# Patient Record
Sex: Male | Born: 1985 | Hispanic: Yes | Marital: Single | Smoking: Current some day smoker
Health system: Southern US, Community
[De-identification: ages and names within clinical notes are randomized; demographics above are authoritative.]

---

## 2014-01-23 ENCOUNTER — Emergency Department: Payer: Self-pay | Admitting: Emergency Medicine

## 2014-01-23 LAB — CK TOTAL AND CKMB (NOT AT ARMC)
CK, Total: 128 U/L
CK-MB: 0.8 ng/mL (ref 0.5–3.6)

## 2014-01-23 LAB — BASIC METABOLIC PANEL
ANION GAP: 8 (ref 7–16)
BUN: 8 mg/dL (ref 7–18)
CO2: 27 mmol/L (ref 21–32)
Calcium, Total: 9.2 mg/dL (ref 8.5–10.1)
Chloride: 105 mmol/L (ref 98–107)
Creatinine: 0.89 mg/dL (ref 0.60–1.30)
EGFR (African American): >60
EGFR (Non-African Amer.): >60
Glucose: 122 mg/dL — ABNORMAL HIGH (ref 65–99)
Osmolality: 279 (ref 275–301)
POTASSIUM: 3.8 mmol/L (ref 3.5–5.1)
Sodium: 140 mmol/L (ref 136–145)

## 2014-01-23 LAB — CBC
HCT: 42.5 % (ref 40.0–52.0)
HGB: 14.6 g/dL (ref 13.0–18.0)
MCH: 29 pg (ref 26.0–34.0)
MCHC: 34.2 g/dL (ref 32.0–36.0)
MCV: 85 fL (ref 80–100)
PLATELETS: 232 10*3/uL (ref 150–440)
RBC: 5.02 10*6/uL (ref 4.40–5.90)
RDW: 13.2 % (ref 11.5–14.5)
WBC: 7.9 10*3/uL (ref 3.8–10.6)

## 2014-01-23 LAB — TROPONIN I

## 2015-08-11 ENCOUNTER — Emergency Department
Admission: EM | Admit: 2015-08-11 | Discharge: 2015-08-11 | Disposition: A | Discharge status: Home / Self Care | Payer: Self-pay

## 2015-08-26 IMAGING — CR DG CHEST 2V
1 series · 2 of 2 positions shown · non-contrast
Comparison: None.

CLINICAL DATA: Left-sided chest pain.

EXAM:
CHEST  2 VIEW

[Series 1: dxr chest pa (or ap) and lateral · 0.14mm/px · 2 of 2 slices shown]
[im 1/2]
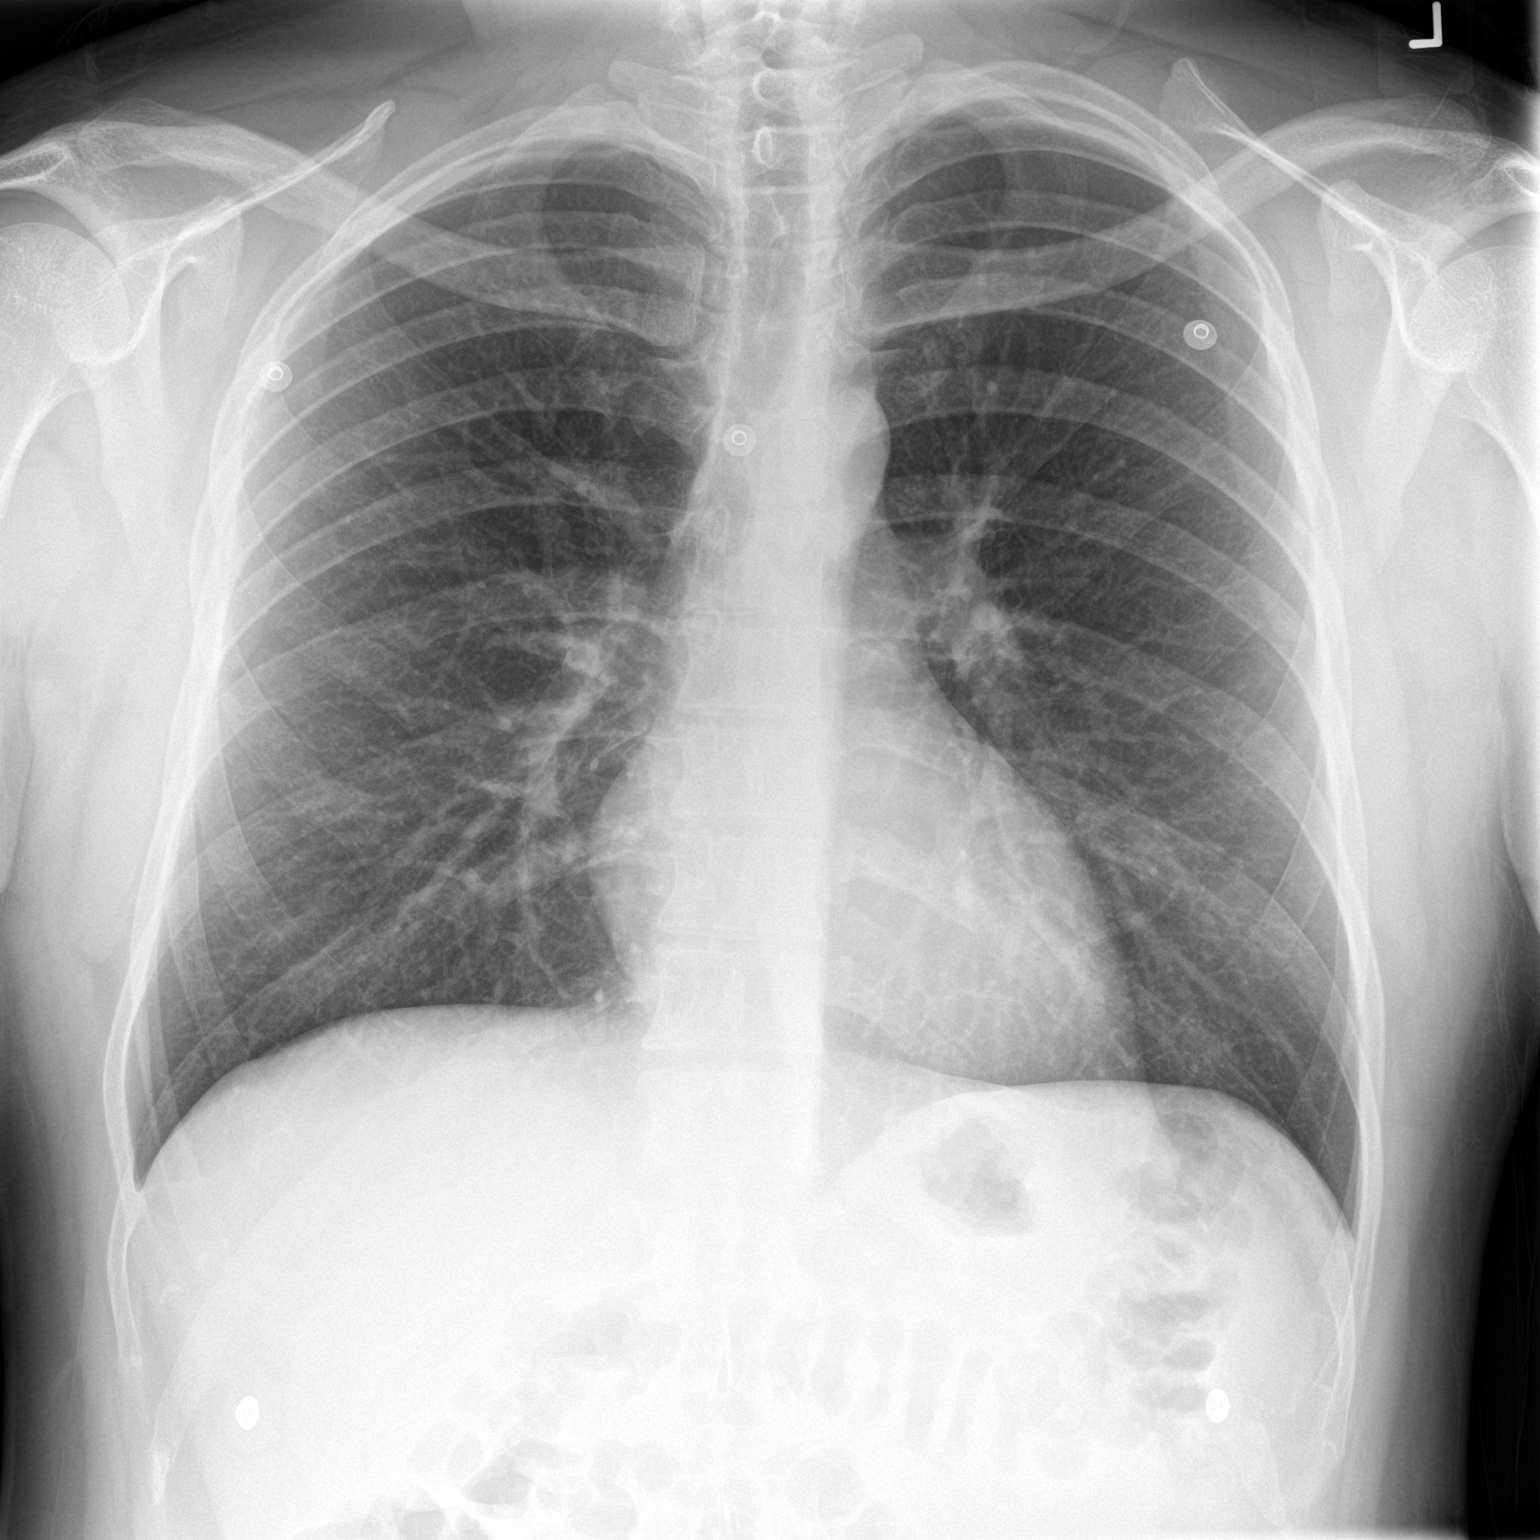
[im 2/2]
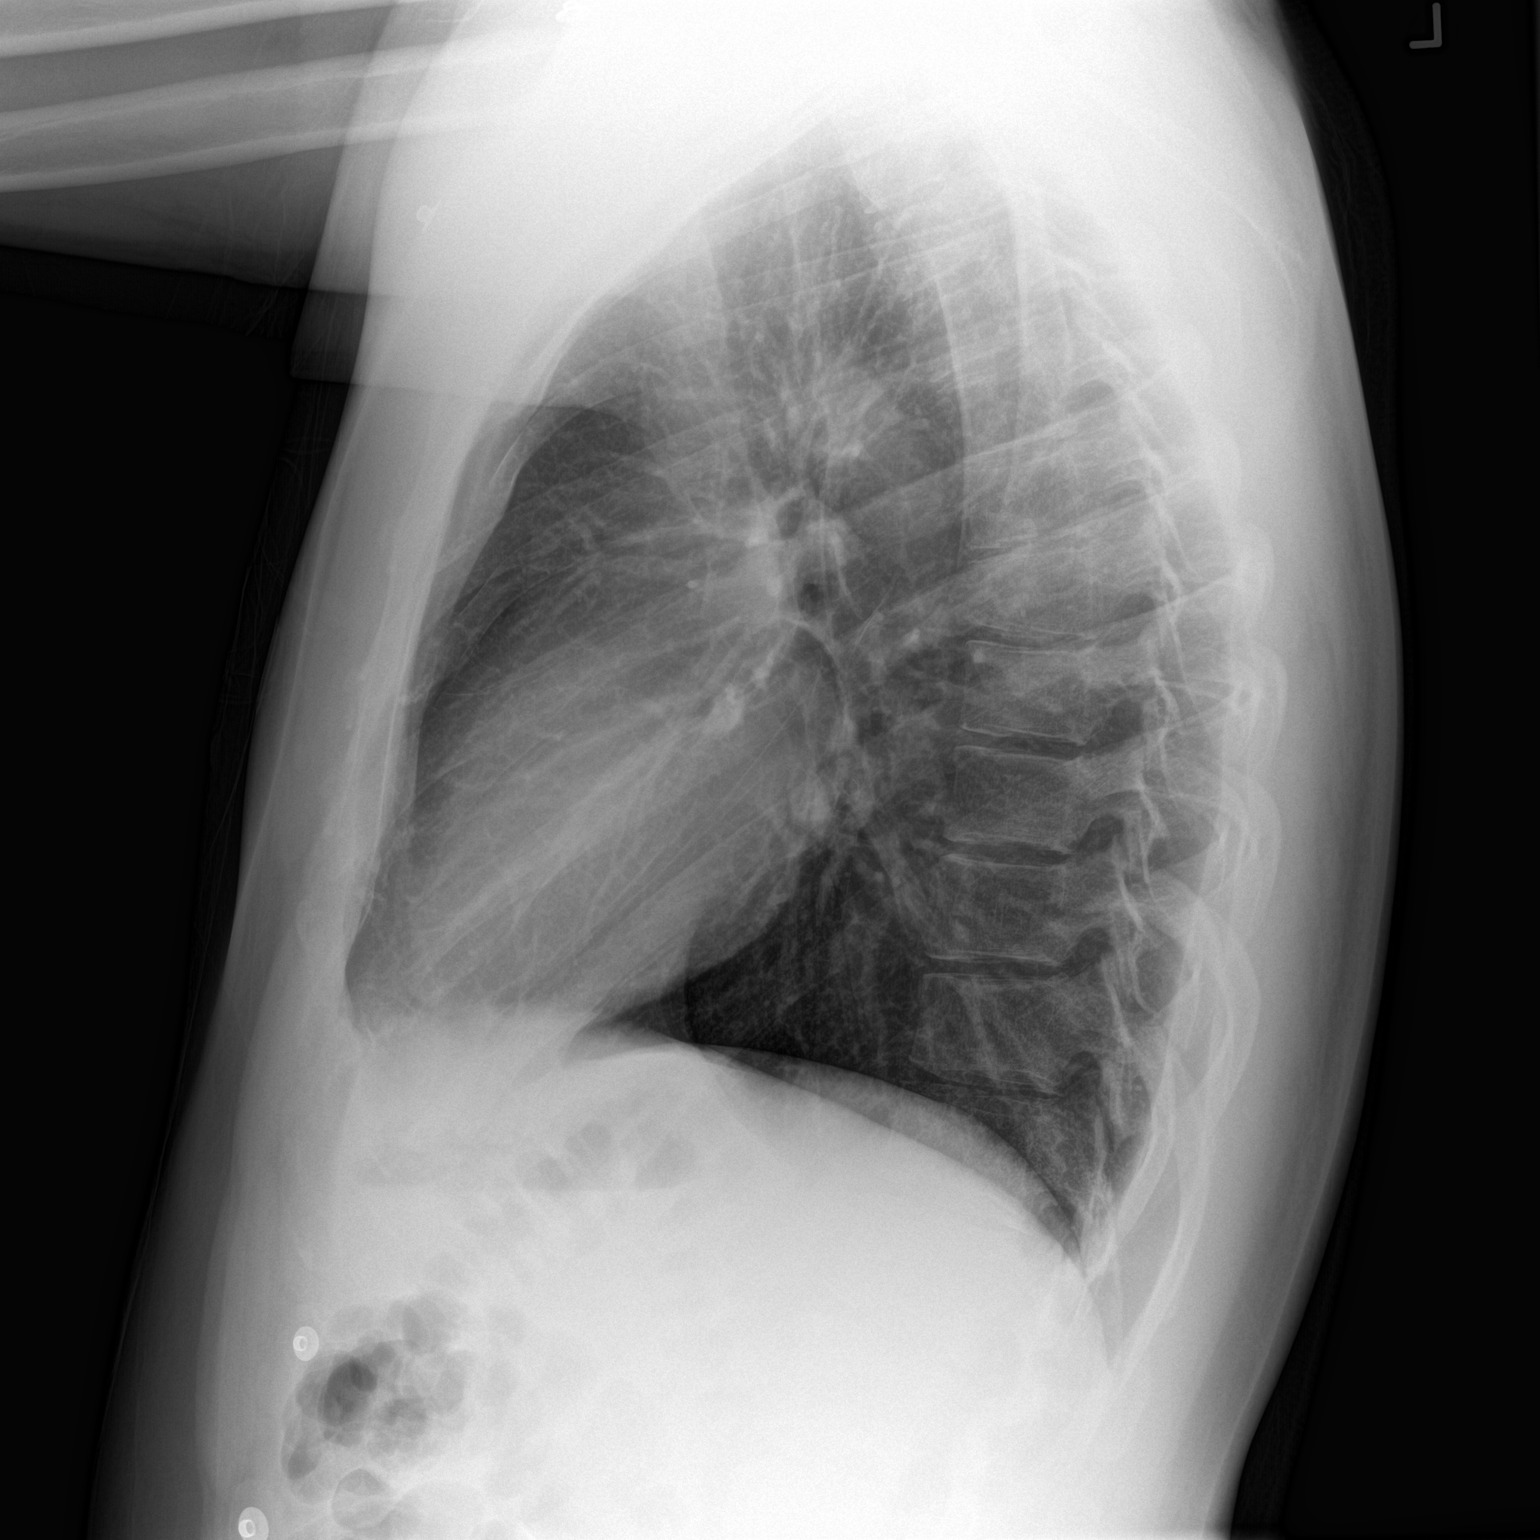

[2 of 2 positions shown; findings below may reference images not displayed]

FINDINGS: The heart size and mediastinal contours are within normal limits.
Both lungs are clear. The visualized skeletal structures are
unremarkable.
IMPRESSION: No active cardiopulmonary disease.

## 2018-11-30 ENCOUNTER — Other Ambulatory Visit: Payer: Self-pay

## 2018-11-30 DIAGNOSIS — Z20822 Contact with and (suspected) exposure to covid-19: Secondary | ICD-10-CM

## 2018-12-06 LAB — NOVEL CORONAVIRUS, NAA: SARS-CoV-2, NAA: NOT DETECTED

## 2018-12-14 ENCOUNTER — Telehealth: Payer: Self-pay | Admitting: General Practice

## 2018-12-14 NOTE — Telephone Encounter (Signed)
Pt was given covid-19(not detected) result/ Pt verbalized understanding  °

## 2020-12-10 ENCOUNTER — Observation Stay: Payer: Self-pay | Admitting: Anesthesiology

## 2020-12-10 ENCOUNTER — Inpatient Hospital Stay
Admission: EM | Admit: 2020-12-10 | Discharge: 2020-12-15 | DRG: 340 | Disposition: A | Discharge status: Home / Self Care | Payer: Self-pay | Attending: Surgery | Admitting: Surgery

## 2020-12-10 ENCOUNTER — Encounter
Admission: EM | Disposition: A | Discharge status: Home / Self Care | Payer: Self-pay | Source: Home / Self Care | Attending: Surgery

## 2020-12-10 ENCOUNTER — Emergency Department: Payer: Self-pay

## 2020-12-10 ENCOUNTER — Encounter: Payer: Self-pay | Admitting: Intensive Care

## 2020-12-10 ENCOUNTER — Other Ambulatory Visit: Payer: Self-pay

## 2020-12-10 ENCOUNTER — Observation Stay: Payer: Self-pay

## 2020-12-10 DIAGNOSIS — Z20822 Contact with and (suspected) exposure to covid-19: Secondary | ICD-10-CM | POA: Diagnosis present

## 2020-12-10 DIAGNOSIS — K3580 Unspecified acute appendicitis: Secondary | ICD-10-CM | POA: Diagnosis present

## 2020-12-10 DIAGNOSIS — Z4659 Encounter for fitting and adjustment of other gastrointestinal appliance and device: Secondary | ICD-10-CM

## 2020-12-10 DIAGNOSIS — F17210 Nicotine dependence, cigarettes, uncomplicated: Secondary | ICD-10-CM | POA: Diagnosis present

## 2020-12-10 DIAGNOSIS — K3532 Acute appendicitis with perforation and localized peritonitis, without abscess: Secondary | ICD-10-CM

## 2020-12-10 DIAGNOSIS — K3533 Acute appendicitis with perforation and localized peritonitis, with abscess: Principal | ICD-10-CM | POA: Diagnosis present

## 2020-12-10 HISTORY — PX: XI ROBOTIC LAPAROSCOPIC ASSISTED APPENDECTOMY: SHX6877

## 2020-12-10 LAB — CBC
HCT: 42.5 % (ref 39.0–52.0)
Hemoglobin: 15.1 g/dL (ref 13.0–17.0)
MCH: 30.1 pg (ref 26.0–34.0)
MCHC: 35.5 g/dL (ref 30.0–36.0)
MCV: 84.7 fL (ref 80.0–100.0)
Platelets: 262 10*3/uL (ref 150–400)
RBC: 5.02 MIL/uL (ref 4.22–5.81)
RDW: 12.4 % (ref 11.5–15.5)
WBC: 17.9 10*3/uL — ABNORMAL HIGH (ref 4.0–10.5)
nRBC: 0 % (ref 0.0–0.2)

## 2020-12-10 LAB — COMPREHENSIVE METABOLIC PANEL
ALT: 30 U/L (ref 0–44)
AST: 20 U/L (ref 15–41)
Albumin: 4.5 g/dL (ref 3.5–5.0)
Alkaline Phosphatase: 88 U/L (ref 38–126)
Anion gap: 10 (ref 5–15)
BUN: 11 mg/dL (ref 6–20)
CO2: 23 mmol/L (ref 22–32)
Calcium: 9.1 mg/dL (ref 8.9–10.3)
Chloride: 101 mmol/L (ref 98–111)
Creatinine, Ser: 0.8 mg/dL (ref 0.61–1.24)
GFR, Estimated: >60 mL/min (ref >60)
Glucose, Bld: 168 mg/dL — ABNORMAL HIGH (ref 70–99)
Potassium: 3.6 mmol/L (ref 3.5–5.1)
Sodium: 134 mmol/L — ABNORMAL LOW (ref 135–145)
Total Bilirubin: 2.1 mg/dL — ABNORMAL HIGH (ref 0.3–1.2)
Total Protein: 8.2 g/dL — ABNORMAL HIGH (ref 6.5–8.1)

## 2020-12-10 LAB — URINALYSIS, COMPLETE (UACMP) WITH MICROSCOPIC
Bacteria, UA: NONE SEEN
Bilirubin Urine: NEGATIVE
Glucose, UA: NEGATIVE mg/dL
Hgb urine dipstick: NEGATIVE
Ketones, ur: NEGATIVE mg/dL
Leukocytes,Ua: NEGATIVE
Nitrite: NEGATIVE
Protein, ur: 30 mg/dL — AB
Specific Gravity, Urine: 1.032 — ABNORMAL HIGH (ref 1.005–1.030)
Squamous Epithelial / HPF: NONE SEEN (ref 0–5)
pH: 6 (ref 5.0–8.0)

## 2020-12-10 LAB — RESP PANEL BY RT-PCR (FLU A&B, COVID) ARPGX2
Influenza A by PCR: NEGATIVE
Influenza B by PCR: NEGATIVE
SARS Coronavirus 2 by RT PCR: NEGATIVE

## 2020-12-10 LAB — LIPASE, BLOOD: Lipase: 41 U/L (ref 11–51)

## 2020-12-10 SURGERY — APPENDECTOMY, ROBOT-ASSISTED, LAPAROSCOPIC
Anesthesia: General | Site: Abdomen

## 2020-12-10 MED ORDER — SODIUM CHLORIDE 0.9 % IV SOLN
INTRAVENOUS | Status: DC | PRN
  Administered 2020-12-10 – 2020-12-13 (×3): 250 mL via INTRAVENOUS
  Administered 2020-12-14 – 2020-12-15 (×2): 1000 mL via INTRAVENOUS

## 2020-12-10 MED ORDER — KETOROLAC TROMETHAMINE 30 MG/ML IJ SOLN
INTRAMUSCULAR | Status: AC
  Filled 2020-12-10: qty 1

## 2020-12-10 MED ORDER — 0.9 % SODIUM CHLORIDE (POUR BTL) OPTIME
TOPICAL | Status: DC | PRN
  Administered 2020-12-10: 15 mL

## 2020-12-10 MED ORDER — ONDANSETRON HCL 4 MG/2ML IJ SOLN
INTRAMUSCULAR | Status: DC | PRN
  Administered 2020-12-10: 4 mg via INTRAVENOUS

## 2020-12-10 MED ORDER — FENTANYL CITRATE (PF) 100 MCG/2ML IJ SOLN: 25.0000 ug | INTRAMUSCULAR | Status: DC | PRN

## 2020-12-10 MED ORDER — ONDANSETRON HCL 4 MG/2ML IJ SOLN
4.0000 mg | Freq: Once | INTRAMUSCULAR | Status: AC
  Administered 2020-12-10: 4 mg via INTRAVENOUS
  Filled 2020-12-10: qty 2

## 2020-12-10 MED ORDER — SODIUM CHLORIDE 0.9 % IR SOLN
Status: DC | PRN
  Administered 2020-12-10: 325 mL

## 2020-12-10 MED ORDER — MORPHINE SULFATE (PF) 4 MG/ML IV SOLN
4.0000 mg | Freq: Once | INTRAVENOUS | Status: AC
  Administered 2020-12-10: 4 mg via INTRAVENOUS
  Filled 2020-12-10: qty 1

## 2020-12-10 MED ORDER — ONDANSETRON HCL 4 MG/2ML IJ SOLN: 4.0000 mg | Freq: Four times a day (QID) | INTRAMUSCULAR | Status: DC | PRN

## 2020-12-10 MED ORDER — HYDROCODONE-ACETAMINOPHEN 5-325 MG PO TABS
1.0000 | ORAL_TABLET | ORAL | Status: DC | PRN
  Administered 2020-12-11: 2 via ORAL
  Administered 2020-12-11 – 2020-12-12 (×4): 1 via ORAL
  Administered 2020-12-13 – 2020-12-15 (×8): 2 via ORAL
  Filled 2020-12-10: qty 1
  Filled 2020-12-10: qty 2
  Filled 2020-12-10: qty 1
  Filled 2020-12-10 (×5): qty 2
  Filled 2020-12-10 (×2): qty 1
  Filled 2020-12-10 (×3): qty 2

## 2020-12-10 MED ORDER — KETOROLAC TROMETHAMINE 30 MG/ML IJ SOLN
INTRAMUSCULAR | Status: DC | PRN
  Administered 2020-12-10: 30 mg via INTRAVENOUS

## 2020-12-10 MED ORDER — PROPOFOL 10 MG/ML IV BOLUS
INTRAVENOUS | Status: DC | PRN
  Administered 2020-12-10: 200 mg via INTRAVENOUS

## 2020-12-10 MED ORDER — ROCURONIUM BROMIDE 100 MG/10ML IV SOLN
INTRAVENOUS | Status: DC | PRN
  Administered 2020-12-10: 5 mg via INTRAVENOUS
  Administered 2020-12-10: 45 mg via INTRAVENOUS

## 2020-12-10 MED ORDER — MIDAZOLAM HCL 2 MG/2ML IJ SOLN
INTRAMUSCULAR | Status: AC
  Filled 2020-12-10: qty 2

## 2020-12-10 MED ORDER — MORPHINE SULFATE (PF) 2 MG/ML IV SOLN
2.0000 mg | INTRAVENOUS | Status: DC | PRN
  Administered 2020-12-11: 2 mg via INTRAVENOUS
  Filled 2020-12-10: qty 1

## 2020-12-10 MED ORDER — TRAMADOL HCL 50 MG PO TABS
50.0000 mg | ORAL_TABLET | Freq: Four times a day (QID) | ORAL | Status: DC | PRN
  Administered 2020-12-11: 50 mg via ORAL
  Filled 2020-12-10: qty 1

## 2020-12-10 MED ORDER — MIDAZOLAM HCL 2 MG/2ML IJ SOLN
INTRAMUSCULAR | Status: DC | PRN
  Administered 2020-12-10: 2 mg via INTRAVENOUS

## 2020-12-10 MED ORDER — DEXMEDETOMIDINE (PRECEDEX) IN NS 20 MCG/5ML (4 MCG/ML) IV SYRINGE
PREFILLED_SYRINGE | INTRAVENOUS | Status: DC | PRN
  Administered 2020-12-10: 8 ug via INTRAVENOUS
  Administered 2020-12-10: 12 ug via INTRAVENOUS

## 2020-12-10 MED ORDER — ENOXAPARIN SODIUM 40 MG/0.4ML IJ SOSY
40.0000 mg | PREFILLED_SYRINGE | INTRAMUSCULAR | Status: DC
Start: 2020-12-11 — End: 2020-12-14
  Administered 2020-12-11 – 2020-12-14 (×4): 40 mg via SUBCUTANEOUS
  Filled 2020-12-10 (×4): qty 0.4

## 2020-12-10 MED ORDER — PIPERACILLIN-TAZOBACTAM 3.375 G IVPB: 3.3750 g | Freq: Three times a day (TID) | INTRAVENOUS | Status: DC

## 2020-12-10 MED ORDER — LACTATED RINGERS IV SOLN: INTRAVENOUS | Status: DC | PRN

## 2020-12-10 MED ORDER — PROMETHAZINE HCL 25 MG/ML IJ SOLN: 6.2500 mg | INTRAMUSCULAR | Status: DC | PRN

## 2020-12-10 MED ORDER — DEXMEDETOMIDINE (PRECEDEX) IN NS 20 MCG/5ML (4 MCG/ML) IV SYRINGE
PREFILLED_SYRINGE | INTRAVENOUS | Status: AC
  Filled 2020-12-10: qty 5

## 2020-12-10 MED ORDER — LIDOCAINE HCL (CARDIAC) PF 100 MG/5ML IV SOSY
PREFILLED_SYRINGE | INTRAVENOUS | Status: DC | PRN
  Administered 2020-12-10: 100 mg via INTRAVENOUS

## 2020-12-10 MED ORDER — BUPIVACAINE HCL (PF) 0.5 % IJ SOLN
INTRAMUSCULAR | Status: DC | PRN
  Administered 2020-12-10: 7.5 mL

## 2020-12-10 MED ORDER — SODIUM CHLORIDE 0.9 % IV SOLN: INTRAVENOUS | Status: DC

## 2020-12-10 MED ORDER — FENTANYL CITRATE (PF) 100 MCG/2ML IJ SOLN
INTRAMUSCULAR | Status: DC | PRN
  Administered 2020-12-10 (×3): 50 ug via INTRAVENOUS

## 2020-12-10 MED ORDER — DEXAMETHASONE SODIUM PHOSPHATE 10 MG/ML IJ SOLN
INTRAMUSCULAR | Status: AC
  Filled 2020-12-10: qty 1

## 2020-12-10 MED ORDER — ROCURONIUM BROMIDE 10 MG/ML (PF) SYRINGE
PREFILLED_SYRINGE | INTRAVENOUS | Status: AC
  Filled 2020-12-10: qty 10

## 2020-12-10 MED ORDER — LACTATED RINGERS IV BOLUS
1000.0000 mL | Freq: Once | INTRAVENOUS | Status: AC
  Administered 2020-12-10: 1000 mL via INTRAVENOUS

## 2020-12-10 MED ORDER — FENTANYL CITRATE (PF) 100 MCG/2ML IJ SOLN
INTRAMUSCULAR | Status: AC
  Filled 2020-12-10: qty 2

## 2020-12-10 MED ORDER — DEXAMETHASONE SODIUM PHOSPHATE 10 MG/ML IJ SOLN
INTRAMUSCULAR | Status: DC | PRN
  Administered 2020-12-10: 10 mg via INTRAVENOUS

## 2020-12-10 MED ORDER — LIDOCAINE-EPINEPHRINE (PF) 1 %-1:200000 IJ SOLN
INTRAMUSCULAR | Status: DC | PRN
  Administered 2020-12-10: 7.5 mL via INTRADERMAL

## 2020-12-10 MED ORDER — OXYMETAZOLINE HCL 0.05 % NA SOLN
NASAL | Status: AC
  Filled 2020-12-10: qty 30

## 2020-12-10 MED ORDER — PIPERACILLIN-TAZOBACTAM 3.375 G IVPB 30 MIN
3.3750 g | Freq: Once | INTRAVENOUS | Status: AC
  Administered 2020-12-10: 3.375 g via INTRAVENOUS
  Filled 2020-12-10: qty 50

## 2020-12-10 MED ORDER — PIPERACILLIN-TAZOBACTAM 3.375 G IVPB
3.3750 g | Freq: Three times a day (TID) | INTRAVENOUS | Status: DC
  Administered 2020-12-11 – 2020-12-15 (×14): 3.375 g via INTRAVENOUS
  Filled 2020-12-10 (×14): qty 50

## 2020-12-10 MED ORDER — ONDANSETRON HCL 4 MG/2ML IJ SOLN
INTRAMUSCULAR | Status: AC
  Filled 2020-12-10: qty 2

## 2020-12-10 MED ORDER — SUCCINYLCHOLINE CHLORIDE 200 MG/10ML IV SOSY
PREFILLED_SYRINGE | INTRAVENOUS | Status: DC | PRN
  Administered 2020-12-10: 120 mg via INTRAVENOUS

## 2020-12-10 MED ORDER — ACETAMINOPHEN 10 MG/ML IV SOLN
INTRAVENOUS | Status: DC | PRN
  Administered 2020-12-10: 1000 mg via INTRAVENOUS

## 2020-12-10 MED ORDER — IOHEXOL 350 MG/ML SOLN
100.0000 mL | Freq: Once | INTRAVENOUS | Status: AC | PRN
  Administered 2020-12-10: 100 mL via INTRAVENOUS

## 2020-12-10 MED ORDER — SUGAMMADEX SODIUM 200 MG/2ML IV SOLN
INTRAVENOUS | Status: DC | PRN
  Administered 2020-12-10: 200 mg via INTRAVENOUS

## 2020-12-10 MED ORDER — LIDOCAINE HCL URETHRAL/MUCOSAL 2 % EX GEL
CUTANEOUS | Status: AC
  Filled 2020-12-10: qty 5

## 2020-12-10 MED ORDER — DOCUSATE SODIUM 100 MG PO CAPS
100.0000 mg | ORAL_CAPSULE | Freq: Two times a day (BID) | ORAL | Status: DC | PRN
  Filled 2020-12-10: qty 1

## 2020-12-10 MED ORDER — ONDANSETRON 4 MG PO TBDP: 4.0000 mg | ORAL_TABLET | Freq: Four times a day (QID) | ORAL | Status: DC | PRN

## 2020-12-10 MED ORDER — PROPOFOL 10 MG/ML IV BOLUS
INTRAVENOUS | Status: AC
  Filled 2020-12-10: qty 20

## 2020-12-10 MED ORDER — ACETAMINOPHEN 10 MG/ML IV SOLN
INTRAVENOUS | Status: AC
  Filled 2020-12-10: qty 100

## 2020-12-10 SURGICAL SUPPLY — 72 items
ANCHOR TIS RET SYS 235ML (MISCELLANEOUS) ×2 IMPLANT
BAG INFUSER PRESSURE 100CC (MISCELLANEOUS) IMPLANT
BLADE SURG SZ11 CARB STEEL (BLADE) ×2 IMPLANT
BULB RESERV EVAC DRAIN JP 100C (MISCELLANEOUS) ×2 IMPLANT
CANISTER SUCT 1200ML W/VALVE (MISCELLANEOUS) IMPLANT
CANNULA REDUC XI 12-8 STAPL (CANNULA) ×1
CANNULA REDUCER 12-8 DVNC XI (CANNULA) ×1 IMPLANT
CHLORAPREP W/TINT 26 (MISCELLANEOUS) ×2 IMPLANT
COVER TIP SHEARS 8 DVNC (MISCELLANEOUS) ×1 IMPLANT
COVER TIP SHEARS 8MM DA VINCI (MISCELLANEOUS) ×1
DEFOGGER SCOPE WARMER CLEARIFY (MISCELLANEOUS) ×2 IMPLANT
DERMABOND ADVANCED (GAUZE/BANDAGES/DRESSINGS) ×1
DERMABOND ADVANCED .7 DNX12 (GAUZE/BANDAGES/DRESSINGS) ×1 IMPLANT
DRAIN CHANNEL JP 15F RND 16 (MISCELLANEOUS) ×2 IMPLANT
DRAPE ARM DVNC X/XI (DISPOSABLE) ×3 IMPLANT
DRAPE COLUMN DVNC XI (DISPOSABLE) ×1 IMPLANT
DRAPE DA VINCI XI ARM (DISPOSABLE) ×3
DRAPE DA VINCI XI COLUMN (DISPOSABLE) ×1
ELECT CAUTERY BLADE 6.4 (BLADE) ×2 IMPLANT
ELECT REM PT RETURN 9FT ADLT (ELECTROSURGICAL) ×2
ELECTRODE REM PT RTRN 9FT ADLT (ELECTROSURGICAL) ×1 IMPLANT
GAUZE 4X4 16PLY ~~LOC~~+RFID DBL (SPONGE) ×2 IMPLANT
GLOVE SURG ENC MOIS LTX SZ7 (GLOVE) ×2 IMPLANT
GLOVE SURG ENC MOIS LTX SZ7.5 (GLOVE) ×2 IMPLANT
GLOVE SURG SYN 6.5 ES PF (GLOVE) ×6 IMPLANT
GLOVE SURG UNDER POLY LF SZ7 (GLOVE) ×6 IMPLANT
GOWN STRL REUS W/ TWL LRG LVL3 (GOWN DISPOSABLE) ×3 IMPLANT
GOWN STRL REUS W/TWL LRG LVL3 (GOWN DISPOSABLE) ×3
GRASPER SUT TROCAR 14GX15 (MISCELLANEOUS) IMPLANT
IRRIGATOR SUCT 8 DISP DVNC XI (IRRIGATION / IRRIGATOR) ×1 IMPLANT
IRRIGATOR SUCTION 8MM XI DISP (IRRIGATION / IRRIGATOR) ×1
IV NS 1000ML (IV SOLUTION) ×1
IV NS 1000ML BAXH (IV SOLUTION) ×1 IMPLANT
JACKSON PRATT 7MM (INSTRUMENTS) IMPLANT
KIT TURNOVER KIT A (KITS) ×2 IMPLANT
LABEL OR SOLS (LABEL) IMPLANT
MANIFOLD NEPTUNE II (INSTRUMENTS) ×2 IMPLANT
NEEDLE HYPO 22GX1.5 SAFETY (NEEDLE) ×2 IMPLANT
NEEDLE INSUFFLATION 14GA 120MM (NEEDLE) ×2 IMPLANT
NS IRRIG 500ML POUR BTL (IV SOLUTION) ×2 IMPLANT
OBTURATOR OPTICAL STANDARD 8MM (TROCAR) ×1
OBTURATOR OPTICAL STND 8 DVNC (TROCAR) ×1
OBTURATOR OPTICALSTD 8 DVNC (TROCAR) ×1 IMPLANT
PACK LAP CHOLECYSTECTOMY (MISCELLANEOUS) ×2 IMPLANT
PENCIL ELECTRO HAND CTR (MISCELLANEOUS) ×2 IMPLANT
RELOAD STAPLER 2.5X45 WHT DVNC (STAPLE) IMPLANT
RELOAD STAPLER 3.5X45 BLU DVNC (STAPLE) ×1 IMPLANT
SEAL CANN UNIV 5-8 DVNC XI (MISCELLANEOUS) ×3 IMPLANT
SEAL XI 5MM-8MM UNIVERSAL (MISCELLANEOUS) ×3
SEALER VESSEL DA VINCI XI (MISCELLANEOUS) ×1
SEALER VESSEL EXT DVNC XI (MISCELLANEOUS) ×1 IMPLANT
SET TUBE SMOKE EVAC HIGH FLOW (TUBING) ×2 IMPLANT
SOLUTION ELECTROLUBE (MISCELLANEOUS) ×2 IMPLANT
SPONGE DRAIN TRACH 4X4 STRL 2S (GAUZE/BANDAGES/DRESSINGS) ×2 IMPLANT
STAPLER 45 DA VINCI SURE FORM (STAPLE) ×1
STAPLER 45 SUREFORM DVNC (STAPLE) ×1 IMPLANT
STAPLER CANNULA SEAL DVNC XI (STAPLE) ×1 IMPLANT
STAPLER CANNULA SEAL XI (STAPLE) ×1
STAPLER RELOAD 2.5X45 WHITE (STAPLE)
STAPLER RELOAD 2.5X45 WHT DVNC (STAPLE)
STAPLER RELOAD 3.5X45 BLU DVNC (STAPLE) ×1
STAPLER RELOAD 3.5X45 BLUE (STAPLE) ×1
SUT ETHILON 3-0 FS-10 30 BLK (SUTURE) ×2
SUT MNCRL AB 4-0 PS2 18 (SUTURE) ×2 IMPLANT
SUT VIC AB 3-0 SH 27 (SUTURE)
SUT VIC AB 3-0 SH 27X BRD (SUTURE) IMPLANT
SUT VICRYL 0 AB UR-6 (SUTURE) ×2 IMPLANT
SUTURE EHLN 3-0 FS-10 30 BLK (SUTURE) ×1 IMPLANT
SYR 30ML LL (SYRINGE) ×2 IMPLANT
SYSTEM WECK SHIELD CLOSURE (TROCAR) IMPLANT
TRAY FOL W/BAG SLVR 16FR STRL (SET/KITS/TRAYS/PACK) ×1 IMPLANT
TRAY FOLEY W/BAG SLVR 16FR LF (SET/KITS/TRAYS/PACK) ×1

## 2020-12-10 NOTE — ED Triage Notes (Signed)
Patient c/o constant RLQ abdominal pain with emesis that started yesterday.

## 2020-12-10 NOTE — Anesthesia Preprocedure Evaluation (Signed)
Anesthesia Evaluation  Patient identified by MRN, date of birth, ID band Patient awake    Reviewed: Allergy & Precautions, H&P , NPO status , Patient's Chart, lab work & pertinent test results, reviewed documented beta blocker date and time   History of Anesthesia Complications Negative for: history of anesthetic complications  Airway Mallampati: III  TM Distance: >3 FB Neck ROM: full    Dental  (+) Dental Advidsory Given, Teeth Intact   Pulmonary neg shortness of breath, neg COPD, neg recent URI, Current Smoker,    Pulmonary exam normal breath sounds clear to auscultation       Cardiovascular Exercise Tolerance: Good negative cardio ROS Normal cardiovascular exam Rhythm:regular Rate:Normal     Neuro/Psych negative neurological ROS  negative psych ROS   GI/Hepatic negative GI ROS, Neg liver ROS,   Endo/Other  negative endocrine ROS  Renal/GU negative Renal ROS  negative genitourinary   Musculoskeletal   Abdominal   Peds  Hematology negative hematology ROS (+)   Anesthesia Other Findings History reviewed. No pertinent past medical history.   Reproductive/Obstetrics negative OB ROS                             Anesthesia Physical Anesthesia Plan  ASA: 2  Anesthesia Plan: General   Post-op Pain Management:    Induction: Intravenous, Rapid sequence and Cricoid pressure planned  PONV Risk Score and Plan: 1 and Ondansetron, Dexamethasone, Midazolam, Treatment may vary due to age or medical condition and Promethazine  Airway Management Planned: Oral ETT  Additional Equipment:   Intra-op Plan:   Post-operative Plan: Extubation in OR  Informed Consent: I have reviewed the patients History and Physical, chart, labs and discussed the procedure including the risks, benefits and alternatives for the proposed anesthesia with the patient or authorized representative who has indicated  his/her understanding and acceptance.     Dental Advisory Given  Plan Discussed with: Anesthesiologist, CRNA and Surgeon  Anesthesia Plan Comments:         Anesthesia Quick Evaluation

## 2020-12-10 NOTE — H&P (Signed)
Subjective:   CC: Acute appendicitis  HPI:  David Bruce is a 35 y.o. male who is consulted by [facility] for evaluation of  above cc.  Symptoms were first noted 1 days ago. Pain is sharp, localized to right lower quadrant.  Associated with anorexia, exacerbated by nothing specific     Past Medical History: None reported  Past Surgical History: None reported  Family History: Reviewed and not relevant to chief complaint  Social History:  reports that he has been smoking cigarettes. He has never used smokeless tobacco. He reports current alcohol use. He reports that he does not use drugs.  Current Medications:  Prior to Admission medications   Not on File    Allergies:  Allergies as of 12/10/2020   (No Known Allergies)    ROS:  General: Denies weight loss, weight gain, fatigue, fevers, chills, and night sweats. Eyes: Denies blurry vision, double vision, eye pain, itchy eyes, and tearing. Ears: Denies hearing loss, earache, and ringing in ears. Nose: Denies sinus pain, congestion, infections, runny nose, and nosebleeds. Mouth/throat: Denies hoarseness, sore throat, bleeding gums, and difficulty swallowing. Heart: Denies chest pain, palpitations, racing heart, irregular heartbeat, leg pain or swelling, and decreased activity tolerance. Respiratory: Denies breathing difficulty, shortness of breath, wheezing, cough, and sputum. GI: Denies, heartburn, nausea, vomiting, constipation, diarrhea, and blood in stool. GU: Denies difficulty urinating, pain with urinating, urgency, frequency, blood in urine. Musculoskeletal: Denies joint stiffness, pain, swelling, muscle weakness. Skin: Denies rash, itching, mass, tumors, sores, and boils Neurologic: Denies headache, fainting, dizziness, seizures, numbness, and tingling. Psychiatric: Denies depression, anxiety, difficulty sleeping, and memory loss. Endocrine: Denies heat or cold intolerance, and increased thirst or  urination. Blood/lymph: Denies easy bruising, easy bruising, and swollen glands     Objective:     BP 132/80 (BP Location: Left Arm)   Pulse 81   Temp 99.5 F (37.5 C) (Oral)   Resp 16   Wt 93 kg   SpO2 98%    Constitutional :  alert, cooperative, appears stated age, and no distress  Lymphatics/Throat:  no asymmetry, masses, or scars  Respiratory:  clear to auscultation bilaterally  Cardiovascular:  regular rate and rhythm  Gastrointestinal: Soft, focal guarding in right lower quadrant with local peritonitis .   Musculoskeletal: Steady gait and movement  Skin: Cool and moist, no surgical scars  Psychiatric: Normal affect, non-agitated, not confused       LABS:  CMP Latest Ref Rng & Units 12/10/2020 01/23/2014  Glucose 70 - 99 mg/dL 119(J) 478(G)  BUN 6 - 20 mg/dL 11 8  Creatinine 9.56 - 1.24 mg/dL 2.13 0.86  Sodium 578 - 145 mmol/L 134(L) 140  Potassium 3.5 - 5.1 mmol/L 3.6 3.8  Chloride 98 - 111 mmol/L 101 105  CO2 22 - 32 mmol/L 23 27  Calcium 8.9 - 10.3 mg/dL 9.1 9.2  Total Protein 6.5 - 8.1 g/dL 8.2(H) -  Total Bilirubin 0.3 - 1.2 mg/dL 2.1(H) -  Alkaline Phos 38 - 126 U/L 88 -  AST 15 - 41 U/L 20 -  ALT 0 - 44 U/L 30 -   CBC Latest Ref Rng & Units 12/10/2020 01/23/2014  WBC 4.0 - 10.5 K/uL 17.9(H) 7.9  Hemoglobin 13.0 - 17.0 g/dL 46.9 62.9  Hematocrit 52.8 - 52.0 % 42.5 42.5  Platelets 150 - 400 K/uL 262 232     RADS: CLINICAL DATA:  Right lower quadrant pain. Appendicitis suspected. Vomiting.   EXAM: CT ABDOMEN AND PELVIS WITH CONTRAST  TECHNIQUE: Multidetector CT imaging of the abdomen and pelvis was performed using the standard protocol following bolus administration of intravenous contrast.   CONTRAST:  OMNIPAQUE IOHEXOL 350 MG/ML SOLN   COMPARISON:  None.   FINDINGS: Lower chest: No acute airspace disease or pleural effusion. Heart is normal in size.   Hepatobiliary: Mild diffusely decreased hepatic density typical of steatosis. More  focal fatty infiltration is seen adjacent to the falciform ligament. No discrete liver lesion. Gallbladder physiologically distended, no calcified stone. No biliary dilatation.   Pancreas: No ductal dilatation or inflammation.   Spleen: Normal in size without focal abnormality.   Adrenals/Urinary Tract: Normal adrenal glands. No hydronephrosis or perinephric edema. Homogeneous renal enhancement. No evidence of focal renal lesion or stone. Urinary bladder is near completely empty.   Stomach/Bowel: Acute appendicitis as described below.   Tiny hiatal hernia. Stomach partially distended. No small bowel obstruction or inflammation. Mild wall thickening of the cecum related to inflamed appendix, likely reactive. Small volume of colonic stool.   Appendix: Location: Medial to the cecum coursing inferiorly into the lateral right lower quadrant.   Diameter: 12-15 mm.   Appendicolith: Multiple intraluminal.   Mucosal hyper-enhancement: Yes. There is wall irregularity consistent with perforation, for example series 2, image 60.   Extraluminal gas: Small foci of extraluminal gas as well as non organized free fluid and fat stranding.   Periappendiceal collection: No focal collection. Periappendiceal fat stranding and non organized free fluid with inflammatory changes tracking into the right lower quadrant and minimally into the pelvis.   Vascular/Lymphatic: Normal caliber abdominal aorta. Patent portal vein and mesenteric vessels. No portal venous or mesenteric gas.   Reproductive: Prostate is unremarkable.   Other: Inflammatory changes in the right lower quadrant related to appendiceal inflammation. Small amount of non organized free fluid tracks distally into the pelvis. No focal abscess. Localized extraluminal gas adjacent to the appendix, not tracking elsewhere. Small fat containing umbilical hernia   Musculoskeletal: There are no acute or suspicious osseous abnormalities.    IMPRESSION: 1. Perforated acute appendicitis. Multiple foci of periappendiceal extraluminal gas with prominent inflammatory changes, but no drainable abscess or organized collection. 2. Mild hepatic steatosis. 3. Small fat containing umbilical hernia.   These results were called by telephone at the time of interpretation on 12/10/2020 at 6:38 pm to provider [facility] , who verbally acknowledged these results.     Electronically Signed   By: Narda Rutherford M.D.   On: 12/10/2020 18:38   Assessment:      Acute appendicitis with localized peritonitis  Plan:      Discussed the risk of surgery including post-op infxn, seroma, hematoma, abscess formation, chronic pain, poor-delayed wound healing, possible bowel resection, possible ostomy, possible conversion to open procedure, post-op SBO or ileus, and need for additional procedures to address said risks.  The risks of general anesthetic including MI, CVA, sudden death or even reaction to anesthetic medications also discussed. Alternatives include continued observation, or antibiotic treatment.  Benefits include possible symptom relief,   Typical post operative recovery of 3-5 days rest, also discussed.  The patient understands the risks, any and all questions were answered to the patient's satisfaction.  2 OR urgently for robotic assisted lap appendectomy.  IV antibiotics given, IV fluids in the meantime

## 2020-12-10 NOTE — ED Notes (Signed)
Consent obtained, patient in hospital gown, awaiting OR tech for transport to surgery

## 2020-12-10 NOTE — Anesthesia Procedure Notes (Signed)
Procedure Name: Intubation Date/Time: 12/10/2020 8:35 PM Performed by: Jonna Clark, CRNA Pre-anesthesia Checklist: Patient identified, Patient being monitored, Timeout performed, Emergency Drugs available and Suction available Patient Re-evaluated:Patient Re-evaluated prior to induction Oxygen Delivery Method: Circle system utilized Preoxygenation: Pre-oxygenation with 100% oxygen Induction Type: IV induction, Rapid sequence and Cricoid Pressure applied Ventilation: Mask ventilation without difficulty Laryngoscope Size: Mac and 3 Grade View: Grade II Tube type: Oral Tube size: 7.0 mm Number of attempts: 1 Placement Confirmation: ETT inserted through vocal cords under direct vision, positive ETCO2 and breath sounds checked- equal and bilateral Secured at: 22 cm Tube secured with: Tape Dental Injury: Teeth and Oropharynx as per pre-operative assessment

## 2020-12-10 NOTE — ED Provider Notes (Signed)
[facility] Emergency Department Provider Note   ____________________________________________   Event Date/Time   First MD Initiated Contact with Patient 12/10/20 1842     (approximate)  I have reviewed the triage vital signs and the nursing notes.   HISTORY  Chief Complaint Abdominal Pain    HPI David Bruce is a 35 y.o. male with no significant past medical history who presents to the ED complaining of abdominal pain.  Patient reports that he has had constant sharp pain in the right lower quadrant of his abdomen for the past 24 hours.  He has been feeling nauseous with multiple episodes of vomiting, denies any diarrhea.  He reports subjective fevers and chills, but has not taken his temperature at home.  He denies any similar symptoms in the past, has never had surgery on his abdomen.        History reviewed. No pertinent past medical history.  There are no problems to display for this patient.   History reviewed. No pertinent surgical history.  Prior to Admission medications   Not on File    Allergies Patient has no known allergies.  History reviewed. No pertinent family history.  Social History Social History   Tobacco Use   Smoking status: Some Days    Types: Cigarettes   Smokeless tobacco: Never  Substance Use Topics   Alcohol use: Yes   Drug use: Never    Review of Systems  Constitutional: Positive for subjective fever/chills Eyes: No visual changes. ENT: No sore throat. Cardiovascular: Denies chest pain. Respiratory: Denies shortness of breath. Gastrointestinal: Positive for abdominal pain, nausea, and vomiting.  No diarrhea.  No constipation. Genitourinary: Negative for dysuria. Musculoskeletal: Negative for back pain. Skin: Negative for rash. Neurological: Negative for headaches, focal weakness or numbness.  ____________________________________________   PHYSICAL EXAM:  VITAL SIGNS: ED Triage Vitals  [12/10/20 1622]  Enc Vitals Group     BP 132/80     Pulse Rate 81     Resp 16     Temp 99.5 F (37.5 C)     Temp Source Oral     SpO2 98 %     Weight 205 lb (93 kg)     Height      Head Circumference      Peak Flow      Pain Score 9     Pain Loc      Pain Edu?      Excl. in GC?     Constitutional: Alert and oriented. Eyes: Conjunctivae are normal. Head: Atraumatic. Nose: No congestion/rhinnorhea. Mouth/Throat: Mucous membranes are moist. Neck: Normal ROM Cardiovascular: Normal rate, regular rhythm. Grossly normal heart sounds. Respiratory: Normal respiratory effort.  No retractions. Lungs CTAB. Gastrointestinal: Soft and tender to palpation in the right lower quadrant with voluntary guarding. No distention. Genitourinary: deferred Musculoskeletal: No lower extremity tenderness nor edema. Neurologic:  Normal speech and language. No gross focal neurologic deficits are appreciated. Skin:  Skin is warm, dry and intact. No rash noted. Psychiatric: Mood and affect are normal. Speech and behavior are normal.  ____________________________________________   LABS (all labs ordered are listed, but only abnormal results are displayed)  Labs Reviewed  COMPREHENSIVE METABOLIC PANEL - Abnormal; Notable for the following components:      Result Value   Sodium 134 (*)    Glucose, Bld 168 (*)    Total Protein 8.2 (*)    Total Bilirubin 2.1 (*)    All other components within normal limits  CBC - Abnormal; Notable for the following components:   WBC 17.9 (*)    All other components within normal limits  URINALYSIS, COMPLETE (UACMP) WITH MICROSCOPIC - Abnormal; Notable for the following components:   Color, Urine YELLOW (*)    APPearance CLOUDY (*)    Specific Gravity, Urine 1.032 (*)    Protein, ur 30 (*)    All other components within normal limits  RESP PANEL BY RT-PCR (FLU A&B, COVID) ARPGX2  LIPASE, BLOOD    PROCEDURES  Procedure(s) performed (including Critical  Care):  Procedures   ____________________________________________   INITIAL IMPRESSION / ASSESSMENT AND PLAN / ED COURSE      35 year old male with no significant past medical history presents to the ED complaining of constant right lower quadrant abdominal pain for the past 24 hours associated with nausea and vomiting.  Patient with focal tenderness in his right lower quadrant and CT scan was performed from triage, now back and significant for ruptured appendicitis.  Findings discussed with Dr. Tonna Boehringer of general surgery, who will evaluate the patient and plan to take him to the OR.  We will give additional IV morphine for pain control, treat with Zosyn.      ____________________________________________   FINAL CLINICAL IMPRESSION(S) / ED DIAGNOSES  Final diagnoses:  Ruptured appendicitis     ED Discharge Orders     None        Note:  This document was prepared using Dragon voice recognition software and may include unintentional dictation errors.    Chesley Noon, MD 12/10/20 [phone]

## 2020-12-10 NOTE — Transfer of Care (Signed)
Immediate Anesthesia Transfer of Care Note  Patient: David Bruce  Procedure(s) Performed: XI ROBOTIC LAPAROSCOPIC ASSISTED APPENDECTOMY (Abdomen)  Patient Location: PACU  Anesthesia Type:General  Level of Consciousness: drowsy and patient cooperative  Airway & Oxygen Therapy: Patient Spontanous Breathing and Patient connected to face mask oxygen  Post-op Assessment: Report given to RN and Post -op Vital signs reviewed and stable  Post vital signs: Reviewed and stable  Last Vitals:  Vitals Value Taken Time  BP 124/80 12/10/20 2224  Temp    Pulse 95 12/10/20 2224  Resp 19 12/10/20 2224  SpO2 95 % 12/10/20 2224  Vitals shown include unvalidated device data.  Last Pain:  Vitals:   12/10/20 1855  TempSrc:   PainSc: 8          Complications: No notable events documented.

## 2020-12-11 ENCOUNTER — Encounter: Payer: Self-pay | Admitting: Surgery

## 2020-12-11 DIAGNOSIS — K3533 Acute appendicitis with perforation and localized peritonitis, with abscess: Secondary | ICD-10-CM | POA: Diagnosis present

## 2020-12-11 LAB — HIV ANTIBODY (ROUTINE TESTING W REFLEX): HIV Screen 4th Generation wRfx: NONREACTIVE

## 2020-12-11 LAB — MAGNESIUM: Magnesium: 2.3 mg/dL (ref 1.7–2.4)

## 2020-12-11 LAB — BASIC METABOLIC PANEL
Anion gap: 10 (ref 5–15)
BUN: 14 mg/dL (ref 6–20)
CO2: 24 mmol/L (ref 22–32)
Calcium: 8.5 mg/dL — ABNORMAL LOW (ref 8.9–10.3)
Chloride: 100 mmol/L (ref 98–111)
Creatinine, Ser: 0.79 mg/dL (ref 0.61–1.24)
GFR, Estimated: >60 mL/min (ref >60)
Glucose, Bld: 162 mg/dL — ABNORMAL HIGH (ref 70–99)
Potassium: 3.9 mmol/L (ref 3.5–5.1)
Sodium: 134 mmol/L — ABNORMAL LOW (ref 135–145)

## 2020-12-11 LAB — CBC
HCT: 40.9 % (ref 39.0–52.0)
Hemoglobin: 14 g/dL (ref 13.0–17.0)
MCH: 29.7 pg (ref 26.0–34.0)
MCHC: 34.2 g/dL (ref 30.0–36.0)
MCV: 86.7 fL (ref 80.0–100.0)
Platelets: 231 10*3/uL (ref 150–400)
RBC: 4.72 MIL/uL (ref 4.22–5.81)
RDW: 12.7 % (ref 11.5–15.5)
WBC: 16.2 10*3/uL — ABNORMAL HIGH (ref 4.0–10.5)
nRBC: 0 % (ref 0.0–0.2)

## 2020-12-11 LAB — PHOSPHORUS: Phosphorus: 2.9 mg/dL (ref 2.5–4.6)

## 2020-12-11 MED ORDER — ACETAMINOPHEN 325 MG PO TABS
650.0000 mg | ORAL_TABLET | Freq: Four times a day (QID) | ORAL | Status: DC | PRN
  Administered 2020-12-11 – 2020-12-12 (×2): 650 mg via ORAL
  Filled 2020-12-11 (×2): qty 2

## 2020-12-11 MED ORDER — SODIUM CHLORIDE 0.9 % IV SOLN: INTRAVENOUS | Status: DC

## 2020-12-11 NOTE — Anesthesia Postprocedure Evaluation (Signed)
Anesthesia Post Note  Patient: David Bruce  Procedure(s) Performed: XI ROBOTIC LAPAROSCOPIC ASSISTED APPENDECTOMY (Abdomen)  Patient location during evaluation: PACU Anesthesia Type: General Level of consciousness: awake and alert Pain management: pain level controlled Vital Signs Assessment: post-procedure vital signs reviewed and stable Respiratory status: spontaneous breathing, nonlabored ventilation, respiratory function stable and patient connected to nasal cannula oxygen Cardiovascular status: blood pressure returned to baseline and stable Postop Assessment: no apparent nausea or vomiting Anesthetic complications: no   No notable events documented.   Last Vitals:  Vitals:   12/10/20 2336 12/11/20 0450  BP: 127/80 121/70  Pulse: 96 100  Resp: 14 16  Temp: 37.3 C 37.7 C  SpO2: 97% 99%    Last Pain:  Vitals:   12/11/20 0450  TempSrc: Oral  PainSc:                  Lenard Simmer

## 2020-12-11 NOTE — Op Note (Addendum)
Preoperative diagnosis: acute appendicitis  Postoperative diagnosis: Same  Procedure: Robotic assisted laparoscopic appendectomy.  Anesthesia: GETA  Surgeon: Sung Amabile  Wound Classification: clean contaminated  Specimen: Appendix  Complications: None  Estimated Blood Loss: 3 mL   Indications: Patient is a 35 y.o. male  presented with above.  Please see H&P for further details.    FIndings: 1.  Gangrenous appendix  2. Obvious infection in the abdomen and peri-appendiceal abscess  3. Normal anatomy 4. Appendiceal artery ligated and divided with vessel sealer 5. Adequate hemostasis.   Description of procedure: The patient was placed on the operating table in the supine position, left arm tucked. General anesthesia was induced. A time-out was completed verifying correct patient, procedure, site, positioning, and implant(s) and/or special equipment prior to beginning this procedure. The abdomen was prepped and draped in the usual sterile fashion.   Palmer's point located and Veress needle was inserted.  After confirming 2 clicks and a positive saline drop test, gas insufflation was initiated until the abdominal pressure was measured at 15 mmHg.  Afterwards, the Veress needle was removed and a 8 mm port was placed through a periumbilical site using Optiview technique after incision with an 11 blade.  After local was infused, 2 additional incision was made 8 cm apart each side along the left side of the abdominal wall from the initial incision.  An 8 mm port was caudaed and 67mm port cephalad from initial incision, both under direct visualization.  No injuries from trocar placements were noted. The table was placed in the Trendelenburg position with the right side elevated.  Xi robotic platform was then brought to the operative field and docked.  A gangrenous appendix was identified and elevated.  During dissection to visualize base of appendix, the body was noted to have adjacent abscess  cavity that was drained completely.  Part of the abscess wall contained the mesoappendix that was very inflammed and brittle, so vessel sealer was used to transect this down to the cecal base.  Fortunately, the cecal base mucosa looked healthy enough to place a blue load staple across base of appendix.  Appendix itself was then separated from rest of surrounding tissue by gentle traction.  No bleeding from the staple lines or vessel sealer noted.  The appendix was placed in an endoscopic retrieval bag and removed.   The appendiceal stump and mesoappendix staple line examined again and hemostasis noted. No other pathology was identified within pelvis. Extensive irrigation done and 32fr Blake placed through RLQ port and secured to skin using 3-0 nylon.  The 12 mm trocar removed and port site closed with PMI using 0 vicryl under direct vision. Remaining trocars were removed under direct vision. No bleeding was noted.The abdomen was allowed to collapse. 3-0 vicryl interrupted used to close 38mm port site at dermal level, and then all skin incisions then closed with subcuticular sutures Monocryl 4-0.  Wounds then dressed with dermabond.  The patient tolerated the procedure well, awakened from anesthesia and was taken to the postanesthesia care unit in satisfactory condition.  Sponge count and instrument count correct at the end of the procedure.

## 2020-12-11 NOTE — Progress Notes (Signed)
Subjective:  CC: David Bruce is a 35 y.o. male  Hospital stay day 0, 1 Day Post-Op perforated, gangernous appendicitis  HPI: No acute issues overnight.  ROS:  General: Denies weight loss, weight gain, fatigue, fevers, chills, and night sweats. Heart: Denies chest pain, palpitations, racing heart, irregular heartbeat, leg pain or swelling, and decreased activity tolerance. Respiratory: Denies breathing difficulty, shortness of breath, wheezing, cough, and sputum. GI: Denies change in appetite, heartburn, nausea, vomiting, constipation, diarrhea, and blood in stool. GU: Denies difficulty urinating, pain with urinating, urgency, frequency, blood in urine.   Objective:   Temp:  [97.3 F (36.3 C)-99.9 F (37.7 C)] 99.9 F (37.7 C) (07/28 0727) Pulse Rate:  [81-103] 103 (07/28 0727) Resp:  [14-18] 16 (07/28 0450) BP: (104-132)/(59-80) 113/73 (07/28 0727) SpO2:  [91 %-100 %] 100 % (07/28 0727) Weight:  [93 kg] 93 kg (07/27 1622)     Height: 5\' 8"  (172.7 cm) Weight: 93 kg     Intake/Output this shift:   Intake/Output Summary (Last 24 hours) at 12/11/2020 1336 Last data filed at 12/11/2020 1012 Gross per 24 hour  Intake 2053.55 ml  Output 280 ml  Net 1773.55 ml    Constitutional :  alert, cooperative, appears stated age, and no distress  Respiratory:  clear to auscultation bilaterally  Cardiovascular:  regular rate and rhythm  Gastrointestinal: Soft, no guarding, focal TTP in RLQ still present.  JP with serous drainage .   Skin: Cool and moist. Incisions c/d/i  Psychiatric: Normal affect, non-agitated, not confused       LABS:  CMP Latest Ref Rng & Units 12/11/2020 12/10/2020 01/23/2014  Glucose 70 - 99 mg/dL 03/25/2014) 329(J) 188(C)  BUN 6 - 20 mg/dL 14 11 8   Creatinine 0.61 - 1.24 mg/dL 166(A 6.30  Sodium 135 - 145 mmol/L 134(L) 134(L) 140  Potassium 3.5 - 5.1 mmol/L 3.9 3.6 3.8  Chloride 98 - 111 mmol/L 100 101 105  CO2 22 - 32 mmol/L 24 23 27   Calcium 8.9 - 10.3  mg/dL 1.60) 9.1 9.2  Total Protein 6.5 - 8.1 g/dL - 8.2(H) -  Total Bilirubin 0.3 - 1.2 mg/dL - 2.1(H) -  Alkaline Phos 38 - 126 U/L - 88 -  AST 15 - 41 U/L - 20 -  ALT 0 - 44 U/L - 30 -   CBC Latest Ref Rng & Units 12/11/2020 12/10/2020 01/23/2014  WBC 4.0 - 10.5 K/uL 16.2(H) 17.9(H) 7.9  Hemoglobin 13.0 - 17.0 g/dL 12/13/2020 12/12/2020 03/25/2014  Hematocrit 39.0 - 52.0 % 40.9 42.5 42.5  Platelets 150 - 400 K/uL 231 262 232    RADS: N/a Assessment:   S/p robo lap appy and abcess drainage for perforated gangrenous appendicitis. Recovering as expected.  Will continue NG due to increased risk of ileus, abx for intra-abdominal abscess

## 2020-12-11 NOTE — Plan of Care (Signed)
  Problem: Education: Goal: Knowledge of General Education information will improve Description: Including pain rating scale, medication(s)/side effects and non-pharmacologic comfort measures Outcome: Progressing Note: Patient profile completed vit Engineer, structural. No complaints of pain. Surgical incisions, not abnormalities. Patient is in agreement with plan of care.

## 2020-12-11 NOTE — Progress Notes (Deleted)
Patient unable to void. She had tried twice sitting on the [facility] without any results. 51ml on bladder scan. Order received from Dr Chipper Herb for NS at 48ml/hr

## 2020-12-12 LAB — CBC
HCT: 40.4 % (ref 39.0–52.0)
Hemoglobin: 13.8 g/dL (ref 13.0–17.0)
MCH: 30.1 pg (ref 26.0–34.0)
MCHC: 34.2 g/dL (ref 30.0–36.0)
MCV: 88.2 fL (ref 80.0–100.0)
Platelets: 196 10*3/uL (ref 150–400)
RBC: 4.58 MIL/uL (ref 4.22–5.81)
RDW: 12.8 % (ref 11.5–15.5)
WBC: 8.2 10*3/uL (ref 4.0–10.5)
nRBC: 0 % (ref 0.0–0.2)

## 2020-12-12 LAB — BASIC METABOLIC PANEL
Anion gap: 4 — ABNORMAL LOW (ref 5–15)
BUN: 15 mg/dL (ref 6–20)
CO2: 28 mmol/L (ref 22–32)
Calcium: 8.3 mg/dL — ABNORMAL LOW (ref 8.9–10.3)
Chloride: 105 mmol/L (ref 98–111)
Creatinine, Ser: 0.91 mg/dL (ref 0.61–1.24)
GFR, Estimated: >60 mL/min (ref >60)
Glucose, Bld: 93 mg/dL (ref 70–99)
Potassium: 4.1 mmol/L (ref 3.5–5.1)
Sodium: 137 mmol/L (ref 135–145)

## 2020-12-12 LAB — SURGICAL PATHOLOGY

## 2020-12-12 LAB — MAGNESIUM: Magnesium: 2.4 mg/dL (ref 1.7–2.4)

## 2020-12-12 LAB — PHOSPHORUS: Phosphorus: 2.4 mg/dL — ABNORMAL LOW (ref 2.5–4.6)

## 2020-12-12 NOTE — Progress Notes (Signed)
Subjective:  CC: David Bruce is a 35 y.o. male  Hospital stay day 1, 2 Days Post-Op perforated, gangernous appendicitis  HPI: No acute issues overnight. Passing flatus, pain improving  ROS:  General: Denies weight loss, weight gain, fatigue, fevers, chills, and night sweats. Heart: Denies chest pain, palpitations, racing heart, irregular heartbeat, leg pain or swelling, and decreased activity tolerance. Respiratory: Denies breathing difficulty, shortness of breath, wheezing, cough, and sputum. GI: Denies change in appetite, heartburn, nausea, vomiting, constipation, diarrhea, and blood in stool. GU: Denies difficulty urinating, pain with urinating, urgency, frequency, blood in urine.   Objective:   Temp:  [98.7 F (37.1 C)-101.4 F (38.6 C)] 98.7 F (37.1 C) (07/29 0819) Pulse Rate:  [79-98] 79 (07/29 0819) Resp:  [15-18] 16 (07/29 0819) BP: (119-137)/(71-81) 124/71 (07/29 0819) SpO2:  [98 %-100 %] 99 % (07/29 0819)     Height: 5\' 8"  (172.7 cm) Weight: 93 kg     Intake/Output this shift:   Intake/Output Summary (Last 24 hours) at 12/12/2020 1022 Last data filed at 12/12/2020 12/14/2020 Gross per 24 hour  Intake 1254.56 ml  Output 525 ml  Net 729.56 ml    Constitutional :  alert, cooperative, appears stated age, and no distress  Respiratory:  clear to auscultation bilaterally  Cardiovascular:  regular rate and rhythm  Gastrointestinal: Soft, no guarding, focal TTP in RLQ improving.  JP with serous drainage .   Skin: Cool and moist. Incisions c/d/i  Psychiatric: Normal affect, non-agitated, not confused       LABS:  CMP Latest Ref Rng & Units 12/12/2020 12/11/2020 12/10/2020  Glucose 70 - 99 mg/dL 93 12/12/2020) 476(L)  BUN 6 - 20 mg/dL 15 14 11   Creatinine 0.61 - 1.24 mg/dL 465(K 3.54  Sodium 135 - 145 mmol/L 137 134(L) 134(L)  Potassium 3.5 - 5.1 mmol/L 4.1 3.9 3.6  Chloride 98 - 111 mmol/L 105 100 101  CO2 22 - 32 mmol/L 28 24 23   Calcium 8.9 - 10.3 mg/dL 8.3(L)  8.5(L) 9.1  Total Protein 6.5 - 8.1 g/dL - - 8.2(H)  Total Bilirubin 0.3 - 1.2 mg/dL - - 2.1(H)  Alkaline Phos 38 - 126 U/L - - 88  AST 15 - 41 U/L - - 20  ALT 0 - 44 U/L - - 30   CBC Latest Ref Rng & Units 12/12/2020 12/11/2020 12/10/2020  WBC 4.0 - 10.5 K/uL 8.2 16.2(H) 17.9(H)  Hemoglobin 13.0 - 17.0 g/dL 12/14/2020 12/13/2020 12/12/2020  Hematocrit 39.0 - 52.0 % 40.4 40.9 42.5  Platelets 150 - 400 K/uL 196 231 262    RADS: N/a Assessment:   S/p robo lap appy and abcess drainage for perforated gangrenous appendicitis. Recovering as expected.  Passing flatus so will do clamp trial.  Clears if he passes.  Advance as tolerated afterwards and hopefully d/c tomorrow or sunday

## 2020-12-13 LAB — CBC
HCT: 38.1 % — ABNORMAL LOW (ref 39.0–52.0)
Hemoglobin: 13.4 g/dL (ref 13.0–17.0)
MCH: 30.6 pg (ref 26.0–34.0)
MCHC: 35.2 g/dL (ref 30.0–36.0)
MCV: 87 fL (ref 80.0–100.0)
Platelets: 227 10*3/uL (ref 150–400)
RBC: 4.38 MIL/uL (ref 4.22–5.81)
RDW: 12 % (ref 11.5–15.5)
WBC: 7.5 10*3/uL (ref 4.0–10.5)
nRBC: 0 % (ref 0.0–0.2)

## 2020-12-13 LAB — BASIC METABOLIC PANEL
Anion gap: 6 (ref 5–15)
BUN: 13 mg/dL (ref 6–20)
CO2: 23 mmol/L (ref 22–32)
Calcium: 8.2 mg/dL — ABNORMAL LOW (ref 8.9–10.3)
Chloride: 105 mmol/L (ref 98–111)
Creatinine, Ser: 0.81 mg/dL (ref 0.61–1.24)
GFR, Estimated: >60 mL/min (ref >60)
Glucose, Bld: 103 mg/dL — ABNORMAL HIGH (ref 70–99)
Potassium: 3.7 mmol/L (ref 3.5–5.1)
Sodium: 134 mmol/L — ABNORMAL LOW (ref 135–145)

## 2020-12-13 LAB — MAGNESIUM: Magnesium: 2.2 mg/dL (ref 1.7–2.4)

## 2020-12-13 LAB — PHOSPHORUS: Phosphorus: 2.7 mg/dL (ref 2.5–4.6)

## 2020-12-13 NOTE — Progress Notes (Signed)
Subjective:  CC: David Bruce is a 35 y.o. male  Hospital stay day 2, 3 Days Post-Op perforated, gangernous appendicitis  HPI: No acute issues overnight.  Reports tolerating clear liquids, continues to deny appetite.  ROS:  General: Denies weight loss, weight gain, fatigue, fevers, chills, and night sweats. Heart: Denies chest pain, palpitations, racing heart, irregular heartbeat, leg pain or swelling, and decreased activity tolerance. Respiratory: Denies breathing difficulty, shortness of breath, wheezing, cough, and sputum. GI: Denies change in appetite, heartburn, nausea, vomiting, constipation, diarrhea, and blood in stool. GU: Denies difficulty urinating, pain with urinating, urgency, frequency, blood in urine.   Objective:   Temp:  [98.1 F (36.7 C)-101.8 F (38.8 C)] 98.1 F (36.7 C) (07/30 0750) Pulse Rate:  [64-87] 64 (07/30 0750) Resp:  [16-20] 20 (07/30 0408) BP: (119-131)/(65-85) 119/65 (07/30 0750) SpO2:  [99 %-100 %] 100 % (07/30 0750)     Height: 5\' 8"  (172.7 cm) Weight: 93 kg     Intake/Output this shift:   Intake/Output Summary (Last 24 hours) at 12/13/2020 0826 Last data filed at 12/13/2020 12/15/2020 Gross per 24 hour  Intake 2163.1 ml  Output 110 ml  Net 2053.1 ml     Constitutional :  alert, cooperative, appears stated age, and no distress  Respiratory:  clear to auscultation bilaterally  Cardiovascular:  regular rate and rhythm  Gastrointestinal: Soft, no guarding, limited focal TTP in RLQ  present.  JP with serous drainage .   Skin: Cool and moist. Incisions c/d/i  Psychiatric: Normal affect, non-agitated, not confused       LABS:  CMP Latest Ref Rng & Units 12/13/2020 12/12/2020 12/11/2020  Glucose 70 - 99 mg/dL 12/13/2020) 93 854(O)  BUN 6 - 20 mg/dL 13 15 14   Creatinine 0.61 - 1.24 mg/dL 270(J 5.00  Sodium 135 - 145 mmol/L 134(L) 137 134(L)  Potassium 3.5 - 5.1 mmol/L 3.7 4.1 3.9  Chloride 98 - 111 mmol/L 105 105 100  CO2 22 - 32 mmol/L 23  28 24   Calcium 8.9 - 10.3 mg/dL 8.2(L) 8.3(L) 8.5(L)  Total Protein 6.5 - 8.1 g/dL - - -  Total Bilirubin 0.3 - 1.2 mg/dL - - -  Alkaline Phos 38 - 126 U/L - - -  AST 15 - 41 U/L - - -  ALT 0 - 44 U/L - - -   CBC Latest Ref Rng & Units 12/13/2020 12/12/2020 12/11/2020  WBC 4.0 - 10.5 K/uL 7.5 8.2 16.2(H)  Hemoglobin 13.0 - 17.0 g/dL 12/15/2020 12/14/2020 12/13/2020  Hematocrit 39.0 - 52.0 % 38.1(L) 40.4 40.9  Platelets 150 - 400 K/uL 227 196 231    Assessment:   S/p robotic assisted laparoscopic appendectomy and abcess drainage for perforated gangrenous appendicitis. Recovering as expected.  We will continue clear liquid diet, while awaiting resumption of bowel function.

## 2020-12-13 NOTE — Progress Notes (Signed)
Mobility Specialist - Progress Note   12/13/20 1600  Mobility  Activity Ambulated in hall  Level of Assistance Independent  Assistive Device None  Distance Ambulated (ft) 640 ft  Mobility Ambulated independently in hallway  Mobility Response Tolerated well  Mobility performed by Mobility specialist  $Mobility charge 1 Mobility    Pt ambulated in hallway independently. No LOB. RA. Pain at incision site 2/10 does increase to 4/10 with ambulation. Pt returned to bed with needs in reach.    Filiberto Pinks Mobility Specialist 12/13/20, 4:03 PM

## 2020-12-13 NOTE — Progress Notes (Signed)
   12/12/20 1942  Assess: MEWS Score  Temp (!) 101.8 F (38.8 C) (I notified the nurse Delaney Meigs.)  BP 128/70  Pulse Rate 87  Resp 20  SpO2 99 %  O2 Device Room Air  Assess: MEWS Score  MEWS Temp 2  MEWS Systolic 0  MEWS Pulse 0  MEWS RR 0  MEWS LOC 0  MEWS Score 2  MEWS Score Color Yellow  Assess: if the MEWS score is Yellow or Red  Were vital signs taken at a resting state? Yes  Focused Assessment Change from prior assessment (see assessment flowsheet)  Does the patient meet 2 or more of the SIRS criteria? No  MEWS guidelines implemented *See Row Information* Yes  Treat  MEWS Interventions Administered prn meds/treatments  Pain Scale 0-10  Pain Score 2  Pain Type Surgical pain  Pain Location Abdomen  Pain Orientation Right  Pain Descriptors / Indicators Sore  Pain Frequency Intermittent  Pain Onset On-going  Patients Stated Pain Goal 0  Pain Intervention(s) Repositioned;Medication (See eMAR);Relaxation  Take Vital Signs  Increase Vital Sign Frequency  Yellow: Q 2hr X 2 then Q 4hr X 2, if remains yellow, continue Q 4hrs  Escalate  MEWS: Escalate Yellow: discuss with charge nurse/RN and consider discussing with provider and RRT  Notify: Charge Nurse/RN  Name of Charge Nurse/RN Notified Raynelle Highland  Date Charge Nurse/RN Notified 12/12/20  Time Charge Nurse/RN Notified 1953  Notify: Provider  Provider Name/Title not notified at this time  Notify: Rapid Response  Name of Rapid Response RN Notified not notified at this time  Assess: SIRS CRITERIA  SIRS Temperature  1  SIRS Pulse 0  SIRS Respirations  0  SIRS WBC 0  SIRS Score Sum  1  Inserted for Margaret Pyle RN

## 2020-12-13 NOTE — Plan of Care (Signed)
Continuing with plan of care. 

## 2020-12-14 LAB — CBC
HCT: 38 % — ABNORMAL LOW (ref 39.0–52.0)
Hemoglobin: 13 g/dL (ref 13.0–17.0)
MCH: 29.5 pg (ref 26.0–34.0)
MCHC: 34.2 g/dL (ref 30.0–36.0)
MCV: 86.4 fL (ref 80.0–100.0)
Platelets: 269 10*3/uL (ref 150–400)
RBC: 4.4 MIL/uL (ref 4.22–5.81)
RDW: 11.9 % (ref 11.5–15.5)
WBC: 6 10*3/uL (ref 4.0–10.5)
nRBC: 0 % (ref 0.0–0.2)

## 2020-12-14 LAB — BASIC METABOLIC PANEL
Anion gap: 4 — ABNORMAL LOW (ref 5–15)
BUN: 10 mg/dL (ref 6–20)
CO2: 27 mmol/L (ref 22–32)
Calcium: 8.5 mg/dL — ABNORMAL LOW (ref 8.9–10.3)
Chloride: 104 mmol/L (ref 98–111)
Creatinine, Ser: 0.87 mg/dL (ref 0.61–1.24)
GFR, Estimated: >60 mL/min (ref >60)
Glucose, Bld: 94 mg/dL (ref 70–99)
Potassium: 3.7 mmol/L (ref 3.5–5.1)
Sodium: 135 mmol/L (ref 135–145)

## 2020-12-14 LAB — PHOSPHORUS: Phosphorus: 4.4 mg/dL (ref 2.5–4.6)

## 2020-12-14 LAB — MAGNESIUM: Magnesium: 2.3 mg/dL (ref 1.7–2.4)

## 2020-12-14 NOTE — Progress Notes (Signed)
Subjective:  CC: David Bruce is a 35 y.o. male  Hospital stay day 3, 4 Days Post-Op perforated, gangernous appendicitis  HPI: No acute issues overnight.  Reports tolerating clear liquids well, with improved appetite.  ROS:  General: Denies weight loss, weight gain, fatigue, fevers, chills, and night sweats. Heart: Denies chest pain, palpitations, racing heart, irregular heartbeat, leg pain or swelling, and decreased activity tolerance. Respiratory: Denies breathing difficulty, shortness of breath, wheezing, cough, and sputum. GI: Denies change in appetite, heartburn, nausea, vomiting, constipation, diarrhea, and blood in stool. GU: Denies difficulty urinating, pain with urinating, urgency, frequency, blood in urine.   Objective:   Temp:  [98.3 F (36.8 C)-99 F (37.2 C)] 98.3 F (36.8 C) (07/31 0800) Pulse Rate:  [62-76] 62 (07/31 0800) Resp:  [16-20] 16 (07/31 0800) BP: (111-121)/(71-76) 119/74 (07/31 0800) SpO2:  [98 %-100 %] 100 % (07/31 0800)     Height: 5\' 8"  (172.7 cm) Weight: 93 kg     Intake/Output this shift:   Intake/Output Summary (Last 24 hours) at 12/14/2020 1342 Last data filed at 12/14/2020 0939 Gross per 24 hour  Intake 1740.35 ml  Output 50 ml  Net 1690.35 ml     Constitutional :  alert, cooperative, appears stated age, and no distress  Respiratory:  clear to auscultation bilaterally  Cardiovascular:  regular rate and rhythm  Gastrointestinal: Soft, no guarding, minimal residual TTP in RLQ  present.  JP with serous drainage .   Skin: Cool and moist. Incisions c/d/i  Psychiatric: Normal affect, non-agitated, not confused       LABS:  CMP Latest Ref Rng & Units 12/14/2020 12/13/2020 12/12/2020  Glucose 70 - 99 mg/dL 94 12/14/2020) 93  BUN 6 - 20 mg/dL 10 13 15   Creatinine 0.61 - 1.24 mg/dL 329(J 1.88  Sodium 135 - 145 mmol/L 135 134(L) 137  Potassium 3.5 - 5.1 mmol/L 3.7 3.7 4.1  Chloride 98 - 111 mmol/L 104 105 105  CO2 22 - 32 mmol/L 27 23 28    Calcium 8.9 - 10.3 mg/dL 4.16) 6.06) 8.3(L)  Total Protein 6.5 - 8.1 g/dL - - -  Total Bilirubin 0.3 - 1.2 mg/dL - - -  Alkaline Phos 38 - 126 U/L - - -  AST 15 - 41 U/L - - -  ALT 0 - 44 U/L - - -   CBC Latest Ref Rng & Units 12/14/2020 12/13/2020 12/12/2020  WBC 4.0 - 10.5 K/uL 6.0 7.5 8.2  Hemoglobin 13.0 - 17.0 g/dL 12/16/2020 12/15/2020 12/14/2020  Hematocrit 39.0 - 52.0 % 38.0(L) 38.1(L) 40.4  Platelets 150 - 400 K/uL 269 227 196    Assessment:   S/p robotic assisted laparoscopic appendectomy and abcess drainage for perforated gangrenous appendicitis. Recovering as expected.  We will advance his diet anticipating discharge in a.m.

## 2020-12-14 NOTE — Plan of Care (Signed)
Continuing with plan of care. 

## 2020-12-15 MED ORDER — HYDROCODONE-ACETAMINOPHEN 5-325 MG PO TABS: 1.0000 | ORAL_TABLET | Freq: Four times a day (QID) | ORAL | 0 refills | Status: AC | PRN

## 2020-12-15 MED ORDER — AMOXICILLIN-POT CLAVULANATE 875-125 MG PO TABS: 1.0000 | ORAL_TABLET | Freq: Two times a day (BID) | ORAL | 0 refills | Status: AC

## 2020-12-15 MED ORDER — DOCUSATE SODIUM 100 MG PO CAPS: 100.0000 mg | ORAL_CAPSULE | Freq: Two times a day (BID) | ORAL | 0 refills | Status: AC | PRN

## 2020-12-15 MED ORDER — IBUPROFEN 800 MG PO TABS: 800.0000 mg | ORAL_TABLET | Freq: Three times a day (TID) | ORAL | 0 refills | Status: AC | PRN

## 2020-12-15 NOTE — TOC Transition Note (Signed)
Transition of Care [facility]) - CM/SW Discharge Note   Patient Details  Name: David Bruce MRN: [id number] Date of Birth: 1985/05/28  Transition of Care [facility]) CM/SW Contact:  Candie Chroman, LCSW Phone Number: 12/15/2020, 9:37 AM   Clinical Narrative:   Patient has orders to discharge home today. Per RN, he does not need an interpreter. CSW met with patient. No supports at bedside. CSW introduced role. Patient confirmed he does not have insurance or a PCP. Provided booklet for free and low-cost healthcare in [facility] including [facility] intake paperwork. Also gave GoodRx coupons for new prescriptions. Price total is around $48 which patient stated he can afford. No further concerns. Patient said he should have a ride home today. CSW signing off.  Final next level of care: Home/Self Care Barriers to Discharge: No Barriers Identified   Patient Goals and CMS Choice        Discharge Placement                    Patient and family notified of of transfer: 12/15/20  Discharge Plan and Services                                     Social Determinants of Health (SDOH) Interventions     Readmission Risk Interventions No flowsheet data found.

## 2020-12-15 NOTE — Progress Notes (Signed)
Pt discharged home in care of family via private vehicle. Discharge instructions provided in Spanish and pt departed with all belongings and instructions, to include [facility] referral.

## 2020-12-15 NOTE — Discharge Summary (Signed)
Physician Discharge Summary  Patient ID: David Bruce MRN: [id number] DOB/AGE: 33-Jun-1987 33 y.o.  Admit date: 12/10/2020 Discharge date: 12/15/2020  Admission Diagnoses: acute, perforated appendicitis  Discharge Diagnoses:  Same as above  Discharged Condition: good  Hospital Course: admitted for above.  Underwent robotic lap appy and abdominal abscess drainage.  See op note for details.  Post op, slowly recovered bowel function, with removal of NG and advancement of diet.  At time of discharge, pain controlled, tolerating diet, having Bms.  Home with abx to complete course due to abscess noted intraop, with JP remaining in place.  Consults: None  Discharge Exam: Blood pressure 120/73, pulse 64, temperature 98.4 F (36.9 C), temperature source Oral, resp. rate 16, height 5\' 8"  (1.727 m), weight 93 kg, SpO2 99 %. General appearance: alert, cooperative, and no distress GI: soft, non-tender; bowel sounds normal; no masses,  no organomegaly and incision c/d/I and JP with serous drainage  Disposition:  Discharge disposition: 01-Home or Self Care       Discharge Instructions     Discharge patient   Complete by: As directed    Once tolerating regular diet   Discharge disposition: 01-Home or Self Care   Discharge patient date: 12/15/2020      Allergies as of 12/15/2020   No Known Allergies      Medication List     TAKE these medications    acetaminophen 500 MG tablet Commonly known as: TYLENOL Take 500 mg by mouth every 8 (eight) hours as needed for moderate pain.   amoxicillin-clavulanate 875-125 MG tablet Commonly known as: Augmentin Take 1 tablet by mouth 2 (two) times daily for 7 days.   docusate sodium 100 MG capsule Commonly known as: Colace Take 1 capsule (100 mg total) by mouth 2 (two) times daily as needed for up to 10 days for mild constipation.   HYDROcodone-acetaminophen 5-325 MG tablet Commonly known as: Norco Take 1 tablet by mouth every 6  (six) hours as needed for up to 6 doses for moderate pain.   ibuprofen 800 MG tablet Commonly known as: ADVIL Take 1 tablet (800 mg total) by mouth every 8 (eight) hours as needed for mild pain or moderate pain.        Follow-up Information     [name], [name], DO. Go on 12/22/2020.   Specialty: Surgery Why: post  op appy and drain removal (9:15am appointment) Contact information: [address] [place] [phone]                  Total time spent arranging discharge was >45min. Signed: 31m 12/15/2020, 1:34 PM

## 2020-12-15 NOTE — Discharge Instructions (Signed)
Laparoscopic Appendectomy, Care After This sheet gives you information about how to care for yourself after your procedure. Your doctor may also give you more specific instructions. If you have problems or questions, contact your doctor. Follow these instructions at home: Care for cuts from surgery (incisions)  Follow instructions from your doctor about how to take care of your cuts from surgery. Make sure you: Wash your hands with soap and water before you change your bandage (dressing). If you cannot use soap and water, use hand sanitizer. Change your bandage as told by your doctor. Leave stitches (sutures), skin glue, or skin tape (adhesive) strips in place. They may need to stay in place for 2 weeks or longer. If tape strips get loose and curl up, you may trim the loose edges. Do not remove tape strips completely unless your doctor says it is okay. Do not take baths, swim, or use a hot tub until your doctor says it is okay. OK TO SHOWER  DRAIN AND RECORD JP OUTPUT DAILY AS INSTRUCTED Check your surgical cut area every day for signs of infection. Check for: More redness, swelling, or pain. More fluid or blood. Warmth. Pus or a bad smell. Activity Do not drive or use heavy machinery while taking prescription pain medicine. Do not play contact sports until your doctor says it is okay. Do not drive for 24 hours if you were given a medicine to help you relax (sedative). Rest as needed. Do not return to work or school until your doctor says it is okay. General instructions  tylenol and advil as needed for discomfort.  Please alternate between the two every four hours as needed for pain.    Use narcotics, if prescribed, only when tylenol and motrin is not enough to control pain.  325-650mg  every 8hrs to max of 3000mg /24hrs (including the 325mg  in every norco dose) for the tylenol.    Advil up to 800mg  per dose every 8hrs as needed for pain.   To prevent or treat constipation while you are  taking prescription pain medicine, your doctor may recommend that you: Drink enough fluid to keep your pee (urine) clear or pale yellow. Take over-the-counter or prescription medicines. Eat foods that are high in fiber, such as fresh fruits and vegetables, whole grains, and beans. Limit foods that are high in fat and processed sugars, such as fried and sweet foods. Contact a doctor if: You develop a rash. You have more redness, swelling, or pain around your surgical cuts. You have more fluid or blood coming from your surgical cuts. Your surgical cuts feel warm to the touch. You have pus or a bad smell coming from your surgical cuts. You have a fever. One or more of your surgical cuts breaks open. Get help right away if: You have trouble breathing. You have chest pain. You have pain that is getting worse in your shoulders. You faint or feel dizzy when you stand. You have very bad pain in your belly (abdomen). You are sick to your stomach (nauseous) for more than one day. You have throwing up (vomiting) that lasts for more than one day. You have leg pain. This information is not intended to replace advice given to you by your health care provider. Make sure you discuss any questions you have with your health care provider. Document Released: 02/10/2008 Document Revised: 11/22/2015 Document Reviewed: 10/20/2015 Elsevier Interactive Patient Education  2019 02/12/2008.

## 2022-07-13 IMAGING — DX DG ABDOMEN 1V
1 series · 1 of 1 positions shown · non-contrast
Comparison: CT 12/10/2020

CLINICAL DATA: NG tube placement

EXAM:
ABDOMEN - 1 VIEW

[abdomen supine]
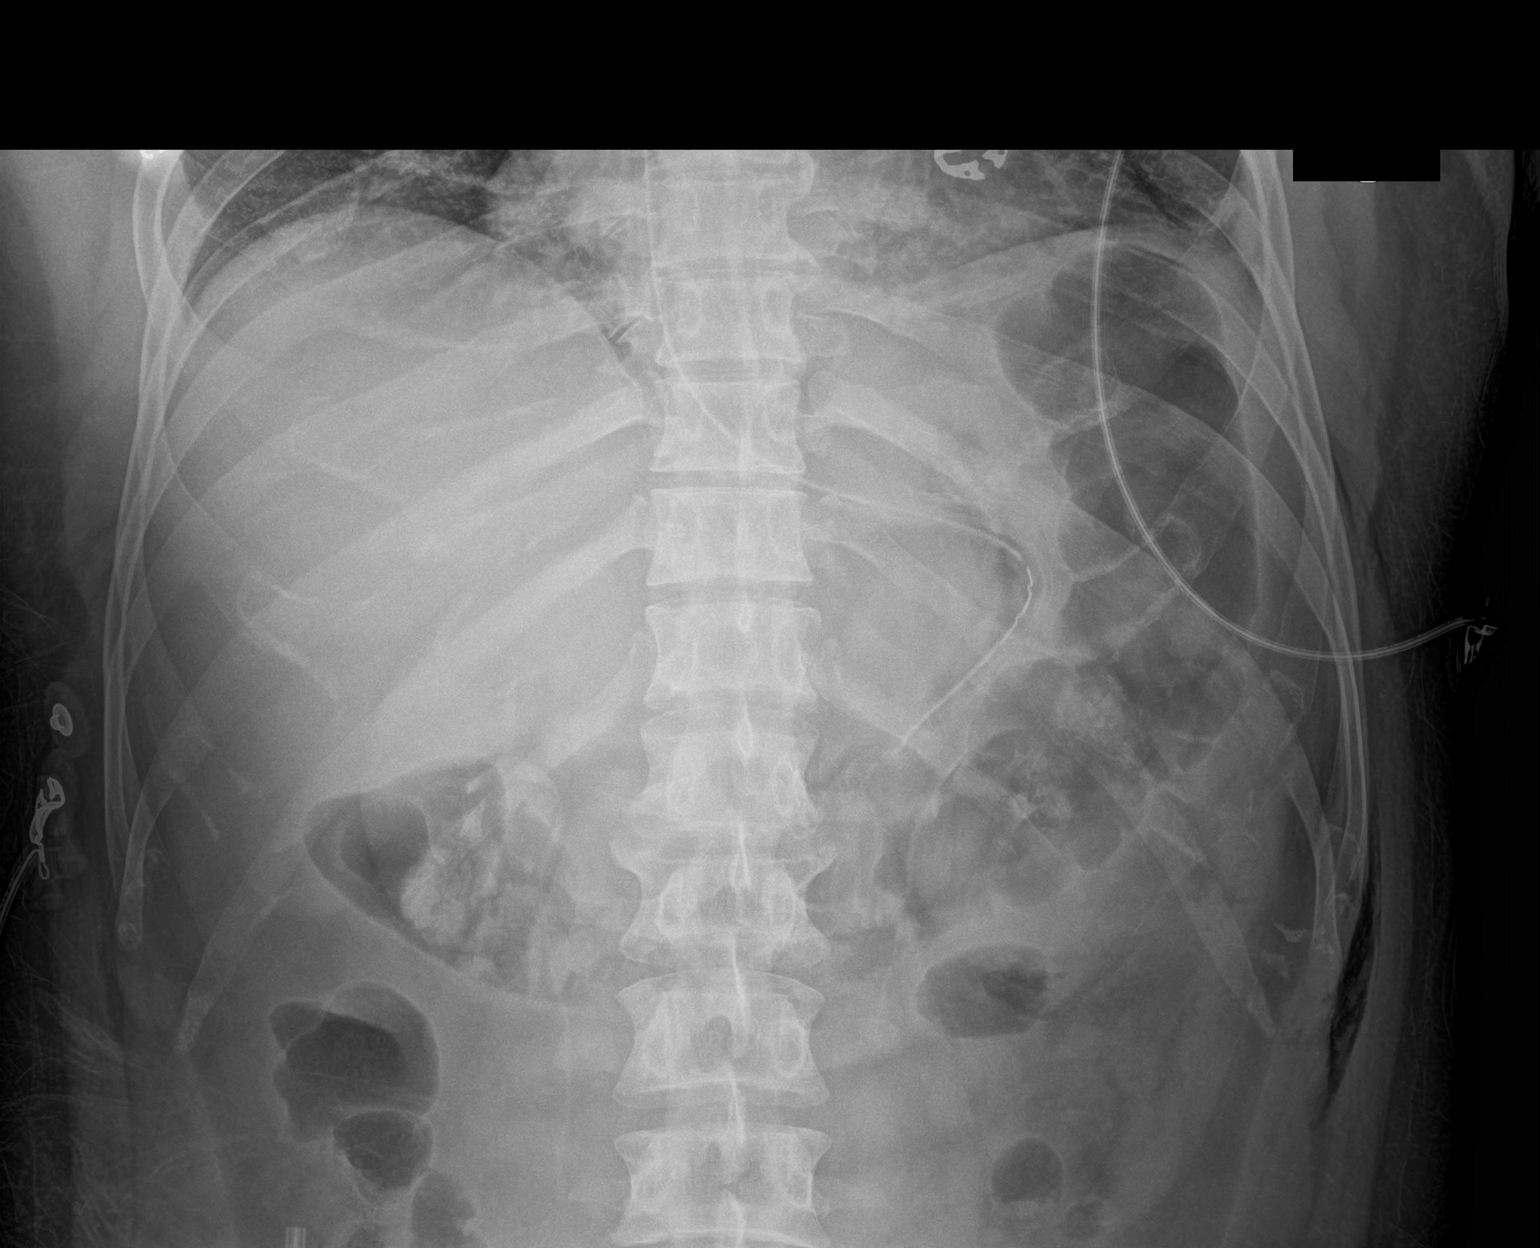

[1 of 1 positions shown; findings below may reference images not displayed]

FINDINGS: Transesophageal tube tip and side port terminate at the level of the
gastric body, beyond the GE junction.

Streaky atelectatic changes present in the lung bases.
Cardiomediastinal contours are unremarkable.

No radiographically evident subdiaphragmatic free air. No distended
loops of bowel. No suspicious abdominal calcifications.
IMPRESSION: Transesophageal tube tip and side port terminate at the level of the
gastric body, beyond the GE junction.
